# Patient Record
Sex: Female | Born: 1965 | Race: Black or African American | Hispanic: No | Marital: Married | Smoking: Former smoker
Health system: Southern US, Community
[De-identification: ages and names within clinical notes are randomized; demographics above are authoritative.]

## PROBLEM LIST (undated history)

## (undated) DIAGNOSIS — D649 Anemia, unspecified: Secondary | ICD-10-CM

## (undated) DIAGNOSIS — E559 Vitamin D deficiency, unspecified: Secondary | ICD-10-CM

## (undated) DIAGNOSIS — I10 Essential (primary) hypertension: Secondary | ICD-10-CM

## (undated) DIAGNOSIS — K219 Gastro-esophageal reflux disease without esophagitis: Secondary | ICD-10-CM

## (undated) DIAGNOSIS — D869 Sarcoidosis, unspecified: Secondary | ICD-10-CM

## (undated) HISTORY — DX: Anemia, unspecified: D64.9

## (undated) HISTORY — DX: Essential (primary) hypertension: I10

## (undated) HISTORY — DX: Sarcoidosis, unspecified: D86.9

## (undated) HISTORY — DX: Gastro-esophageal reflux disease without esophagitis: K21.9

## (undated) HISTORY — DX: Vitamin D deficiency, unspecified: E55.9

---

## 2009-01-19 HISTORY — PX: LAPAROSCOPIC GASTRIC BANDING WITH HIATAL HERNIA REPAIR: SHX6351

## 2013-02-05 ENCOUNTER — Encounter (INDEPENDENT_AMBULATORY_CARE_PROVIDER_SITE_OTHER): Payer: Self-pay | Admitting: General Surgery

## 2013-02-05 ENCOUNTER — Ambulatory Visit (INDEPENDENT_AMBULATORY_CARE_PROVIDER_SITE_OTHER): Payer: BC Managed Care – PPO | Admitting: General Surgery

## 2013-02-05 ENCOUNTER — Encounter (INDEPENDENT_AMBULATORY_CARE_PROVIDER_SITE_OTHER): Payer: Self-pay

## 2013-02-05 VITALS — BP 132/76 | HR 63 | Temp 98.0°F | Resp 18 | Ht 61.0 in | Wt 188.6 lb

## 2013-02-05 DIAGNOSIS — E669 Obesity, unspecified: Secondary | ICD-10-CM

## 2013-02-05 DIAGNOSIS — Z9884 Bariatric surgery status: Secondary | ICD-10-CM

## 2013-02-05 DIAGNOSIS — K219 Gastro-esophageal reflux disease without esophagitis: Secondary | ICD-10-CM

## 2013-02-05 NOTE — Patient Instructions (Addendum)
We are going to schedule a esophagram to evaluate your gastric band position and to make sure it is not too tight.  Eating techniques 20-20-20 (30-30-30) 20 chews, 20 seconds between bites of food, 20 minutes to eat; sometimes you may need 30 chews, 30 seconds etc Use your nondominant hand to eat with Use a child/infant size utensil Try not to eat while watching TV Put fork down between bites of food Focus on food while eating  Walk daily. Consider getting a pedometer and tracking your daily steps and making weekly goals to increase your daily step count  1. Stay on liquids for the next 2 days as you adapt to your new fill volume.  Then resume your previous diet. 2. Decreasing your carbohydrate intake will hasten you weight loss.  Rely more on proteins for your meals.  Avoid condiments that contain sweets such as Honey Mustard and sugary salad dressings.   3. Stay in the "green zone".  If you are regurgitating with meals, having night time reflux, and find yourself eating soft comfort foods (mashed potatoes, potato chips)...realize that you are developing "maladaptive eating".  You will not lose weight this way and may regain weight.  The GREEN ZONE is eating smaller portions and not regurgitating.  Hence we may need to withdraw fluid from your band. 4. Build exercise into your daily routine.  Walking is the best way to start but do something every day if you can.

## 2013-02-06 ENCOUNTER — Other Ambulatory Visit: Payer: Self-pay

## 2013-02-06 ENCOUNTER — Encounter (INDEPENDENT_AMBULATORY_CARE_PROVIDER_SITE_OTHER): Payer: Self-pay | Admitting: General Surgery

## 2013-02-06 DIAGNOSIS — Z9884 Bariatric surgery status: Secondary | ICD-10-CM | POA: Insufficient documentation

## 2013-02-06 NOTE — Progress Notes (Signed)
Patient ID: Becky Berger, female   DOB: 1966/01/07, 47 y.o.   MRN: [id number]  Chief Complaint  Patient presents with  . Bariatric Pre-op    lap band    HPI Becky Berger is a 47 y.o. female.   HPI 47 year old African American female status post laparoscopic adjustable gastric banding with hiatal hernia repair at [facility] on 01/19/2009 by Dr. Clent Ridges is here to transfer her bariatric care to our office. She states that her preoperative weight was 234 pounds. She was last seen in their office on 05/15/2011. Her weight at that time was 192.4 pounds. She states that she has been having issues for the past couple months. She states it is taking her about 45 minutes to eat a plate of food. Sometimes it takes her up to one hour. She states that she is taking small bites at a time. She states that she is not waiting a long period of time between bites of food. She reports a burning sensation in her upper chest primarily occurring at night. This has been going on for quite some time. She underwent an upper endoscopy in [place] by a local gastroenterologist which showed a recurrent hiatal hernia as well as distal gastritis. She was started on a PPI. She denies any nighttime cough. She reports a fair amount of burping. She is taking iron pills for anemia as well as a vitamin D supplement for vitamin D deficiency. She is not really getting much exercise right now. She states that she is taking lots of water. She works in [employer], site. She states that she now are correct having had a gastric band in place. She did not realize that it involved a long-term foreign body. She regrets not having the gastric bypass. Past Medical History  Diagnosis Date  . Sarcoidosis   . HTN (hypertension)   . Anemia   . Vitamin D deficiency disease   . GERD (gastroesophageal reflux disease)     Past Surgical History  Procedure Laterality Date  . Laparoscopic gastric banding with hiatal hernia  repair  01/19/2009    Dr Clent Ridges, [place]; Realize band    Family History  Problem Relation Age of Onset  . Diabetes Mother   . Hypertension Mother   . Cancer Father     prostate    Social History History  Substance Use Topics  . Smoking status: Never Smoker   . Smokeless tobacco: Not on file  . Alcohol Use: Not on file    Allergies  Allergen Reactions  . Sulfur Itching    Current Outpatient Prescriptions  Medication Sig Dispense Refill  . amphetamine-dextroamphetamine (ADDERALL) 20 MG tablet Take 20 mg by mouth daily.      Marland Kitchen atenolol-chlorthalidone (TENORETIC) 50-25 MG per tablet Take 1 tablet by mouth daily.      . Cholecalciferol (D3 SUPER STRENGTH) 2000 UNITS CAPS Take by mouth.      . folic acid (FOLVITE) 1 MG tablet Take 1 mg by mouth daily.      Marland Kitchen omeprazole (PRILOSEC) 20 MG capsule Take 20 mg by mouth daily.      . vitamin B-12 (CYANOCOBALAMIN) 500 MCG tablet Take 500 mcg by mouth daily.       No current facility-administered medications for this visit.    Review of Systems Review of Systems  Constitutional: Negative for fever, activity change, appetite change and unexpected weight change.  HENT: Negative for nosebleeds and trouble swallowing.   Eyes: Negative for  photophobia and visual disturbance.  Respiratory: Negative for chest tightness and shortness of breath.   Cardiovascular: Negative for chest pain and leg swelling.       Denies CP, SOB, orthopnea, PND, DOE  Genitourinary: Negative for dysuria and difficulty urinating.  Musculoskeletal: Negative for arthralgias.  Skin: Negative for pallor and rash.  Neurological: Negative for dizziness, seizures, facial asymmetry and numbness.       Denies TIA and amaurosis fugax   Hematological: Negative for adenopathy. Does not bruise/bleed easily.  Psychiatric/Behavioral: Negative for behavioral problems and agitation.    Blood pressure 132/76, pulse 63, temperature 98 F (36.7 C), resp. rate 18, height 5'  1" (1.549 m), weight 188 lb 9.6 oz (85.548 kg).  Physical Exam Physical Exam  Vitals reviewed. Constitutional: She is oriented to person, place, and time. She appears well-developed and well-nourished. No distress.  HENT:  Head: Normocephalic and atraumatic.  Right Ear: External ear normal.  Left Ear: External ear normal.  Eyes: Conjunctivae are normal. No scleral icterus.  Neck: Normal range of motion. Neck supple. No tracheal deviation present. No thyromegaly present.  Cardiovascular: Normal rate and normal heart sounds.   Pulmonary/Chest: Effort normal and breath sounds normal. No stridor. No respiratory distress. She has no wheezes.  Abdominal: Soft. She exhibits no distension. There is no tenderness. There is no rebound and no guarding.    Soft, nontender, nondistended. Well-healed trocar sites. Palpable port in the left mid abdomen  Musculoskeletal: She exhibits no edema and no tenderness.  Lymphadenopathy:    She has no cervical adenopathy.  Neurological: She is alert and oriented to person, place, and time. She exhibits normal muscle tone.  Skin: Skin is warm and dry. No rash noted. She is not diaphoretic. No erythema.  Psychiatric: She has a normal mood and affect. Her behavior is normal. Judgment and thought content normal.    Data Reviewed Dr. Clent Ridges as operative note from 01/19/2009 at [facility]-laparoscopic adjustable gastric band placement with hiatal hernia repair (realize band)  Office visit note from 05/15/2011-weight 192.4;Band assessed under fluoroscopy with one mL of saline added to give a total of 5 mL's of saline.   Labs from 12/12/2012-hemoglobin 12.9, hematocrit 38.1, platelet count 272, vitamin B 01/25/1991, folate greater than 19.9; hemoglobin A1c 5.9; vitamin D, 25-hydroxy 35.8; prior hemoglobin and hematocrit from 07/04/2012 was 8.4 and 28.2 respectively; Lipid panel from 07/03/2012-total cholesterol 151, triglycerides 35, HDL 46 LDL  98; TSH 0.914; vitamin D, 25-hydroxy-  13  EGD performed by Dr. Samuella Cota on September 3 Shows a small 1-2 cm hiatal hernia, mild pyloric gastritis  Assessment    Obesity, BMI 35.64 Anemia-well controlled Gastroesophageal reflux disease Recurrent hiatal hernia Vitamin D deficiency-stable Burping     Plan    We had a very long conversation. A large amount of time was spent counseling the patient. We discussed proper eating techniques and behaviors with a gastric band. We discussed the importance of taking one small bite, chewing 20-30 times, and waiting 20-30 seconds between bites of food.  With her having epigastric and upper chest burning at night as well as burping I do believe her band is too tight. I have recommended an adjustment. It is also possible that the "recurrent hiatal hernia" could represent a dilated pouch. Based on her upper endoscopy findings as well as her physical complaints I have recommended getting an upper GI to evaluate her anatomy. I do think she needs to have some fluid removed from her  band today.   After obtaining verbal consent, the abdominal wall was prepped with Chloraprep. The port was accessed with a Huber needle 5 cc was aspirated and 1.8 cc of saline was removed to give the patient an expected fill volume of 3.2 cc.  The patient was able to tolerate sips of water.  She was instructed still liquid for the next 24 hours and then advance to soft foods. She was given educational material regarding proper eating techniques and behaviors. We discussed importance of regular physical activity. She'll also be set up for an upper GI to evaluate her band anatomy. We will bring her back in 4 weeks to review the results. She was instructed to call the office and she had ongoing epigastric burning as well as burping  Mary Sella. Andrey Campanile, MD, FACS General, Bariatric, & Minimally Invasive Surgery [facility], [place]         [facility] M 02/06/2013, 11:08  AM

## 2013-02-07 ENCOUNTER — Encounter (INDEPENDENT_AMBULATORY_CARE_PROVIDER_SITE_OTHER): Payer: Self-pay

## 2013-02-10 ENCOUNTER — Ambulatory Visit
Admission: RE | Admit: 2013-02-10 | Discharge: 2013-02-10 | Disposition: A | Discharge status: Home / Self Care | Payer: BC Managed Care – PPO | Source: Ambulatory Visit | Attending: General Surgery | Admitting: General Surgery

## 2013-02-10 DIAGNOSIS — Z9884 Bariatric surgery status: Secondary | ICD-10-CM

## 2013-02-10 DIAGNOSIS — E669 Obesity, unspecified: Secondary | ICD-10-CM

## 2013-02-10 DIAGNOSIS — K219 Gastro-esophageal reflux disease without esophagitis: Secondary | ICD-10-CM

## 2013-02-12 ENCOUNTER — Telehealth (INDEPENDENT_AMBULATORY_CARE_PROVIDER_SITE_OTHER): Payer: Self-pay | Admitting: General Surgery

## 2013-02-12 NOTE — Telephone Encounter (Signed)
LMOM asking pt to return my call so that I can give her the DG esophagus results.

## 2013-02-12 NOTE — Telephone Encounter (Signed)
Message copied by Ignacia Marvel on Wed Feb 12, 2013 10:50 AM ------      Message from: Andrey Campanile, ERIC M      Created: Wed Feb 12, 2013  9:28 AM       pls call pt and let her know band appears to be in nml position. No evidence of hiatal hernia. No dilation of esophagus. Granted, i'm looking at images remotely and will review further when i return to office. She can followup with me the same day as her brother is scheduled. He drives her. Look back to my clinic schedule for the day i saw her and you can find his name and info. They can come on a Thursday. He can see andy and she can see me at the same time ------

## 2013-02-18 ENCOUNTER — Encounter (INDEPENDENT_AMBULATORY_CARE_PROVIDER_SITE_OTHER): Payer: Self-pay

## 2013-03-06 ENCOUNTER — Ambulatory Visit (INDEPENDENT_AMBULATORY_CARE_PROVIDER_SITE_OTHER): Payer: BC Managed Care – PPO | Admitting: General Surgery

## 2013-03-19 ENCOUNTER — Encounter (INDEPENDENT_AMBULATORY_CARE_PROVIDER_SITE_OTHER): Payer: Self-pay

## 2013-03-19 ENCOUNTER — Encounter (INDEPENDENT_AMBULATORY_CARE_PROVIDER_SITE_OTHER): Payer: Self-pay | Admitting: General Surgery

## 2013-03-19 ENCOUNTER — Ambulatory Visit (INDEPENDENT_AMBULATORY_CARE_PROVIDER_SITE_OTHER): Payer: BC Managed Care – PPO | Admitting: General Surgery

## 2013-03-19 VITALS — BP 116/80 | HR 60 | Temp 97.8°F | Resp 16 | Ht 61.0 in | Wt 190.4 lb

## 2013-03-19 DIAGNOSIS — Z9884 Bariatric surgery status: Secondary | ICD-10-CM

## 2013-03-19 DIAGNOSIS — E669 Obesity, unspecified: Secondary | ICD-10-CM

## 2013-03-19 DIAGNOSIS — Z4651 Encounter for fitting and adjustment of gastric lap band: Secondary | ICD-10-CM

## 2013-03-19 NOTE — Patient Instructions (Signed)
1. Stay on liquids for the next 2 days as you adapt to your new fill volume.  Then resume your previous diet. 2. Decreasing your carbohydrate intake will hasten you weight loss.  Rely more on proteins for your meals.  Avoid condiments that contain sweets such as Honey Mustard and sugary salad dressings.   3. Stay in the "green zone".  If you are regurgitating with meals, having night time reflux, and find yourself eating soft comfort foods (mashed potatoes, potato chips)...realize that you are developing "maladaptive eating".  You will not lose weight this way and may regain weight.  The GREEN ZONE is eating smaller portions and not regurgitating.  Hence we may need to withdraw fluid from your band. 4. Build exercise into your daily routine.  Walking is the best way to start but do something every day if you can.    Eating techniques 20-20-20 (30-30-30) 20 chews, 20 seconds between bites of food, 20 minutes to eat; sometimes you may need 30 chews, 30 seconds etc Use your nondominant hand to eat with Use a child/infant size utensil Try not to eat while watching TV

## 2013-03-19 NOTE — Progress Notes (Signed)
Subjective:     Patient ID: Becky Berger, female   DOB: Aug 29, 1965, 48 y.o.   MRN: [id number]  HPI  48 year old African female comes in for followup. I initially met her 02/05/2013. She transferred her care to her office. She had previously undergone laparoscopic adjustable gastric placement of a REALIZE band and hiatal hernia repair in November 2010 at [place]. At her initial visit last month she was having symptoms of her band being too tight. We removed 1.8 cc of fluid. We also got an upper GI which demonstrated normal positioning of the band without any signs of esophageal dilatation. She comes in today for followup. She states she is doing well. She is not having any reflux or regurgitation. It is no longer taking her an hour to the meal. However she is now able the large portions of food. She is hungry throughout the day. She denies any nighttime cough or reflux. She states that she has been practicing the eating behaviors that we talked about. She is now going to the gym about 3 times a week walking 2 miles at a time on a treadmill. She is also lifting weights.  PMHx, PSHx, SOCHx, FAMHx, ALL reviewed and unchanged  Review of Systems 10 point ROS negative except for HPI    Objective:   Physical Exam BP 116/80  Pulse 60  Temp(Src) 97.8 F (36.6 C) (Temporal)  Resp 16  Ht 5' 1"  (1.549 m)  Wt 190 lb 6.4 oz (86.365 kg)  BMI 35.99 kg/m2  See LapBand flowsheet  Gen: alert, NAD, non-toxic appearing HEENT: normocephalic, atraumatic; pupils equal, no scleral icterus, neck supple, no lymphadenopathy Pulm: Lungs clear to auscultation, symmetric chest rise CV: regular rate and rhythm Abd: soft, nontender, nondistended. Well-healed trocar sites. No incisional hernia. Port is in right mid-abdomen Ext: no edema, normal, symmetric strength Neuro: nonfocal, sensation grossly intact Psych: appropriate, judgment normal     Assessment:     Obesity BMI 35.98 Sarcoidosis Hypertension      Plan:     It appears her band is in good position and functioning properly. She has gained about 50 pounds since her initial visit. Her preoperative weight was 234 pounds. Because she is able to eat large portions of food and and is hungry between meals I recommended an adjustment.  After obtaining verbal consent, the abdominal wall was prepped with Chloraprep. The port was accessed with a Huber needle and 0.25 cc of saline was added to give the patient an expected fill volume of 3.45 cc.  The patient was able to tolerate sips of water.  She is instructed still liquids for the next 24 hours. We rediscussed proper eating techniques and behaviors.  We discussed the importance of changing the intensity while exercising in order to maximize caloric burn. Followup 4-6 weeks  Leighton Ruff. Redmond Pulling, MD, FACS General, Bariatric, & Minimally Invasive Surgery [facility], [place]

## 2013-05-01 ENCOUNTER — Encounter (INDEPENDENT_AMBULATORY_CARE_PROVIDER_SITE_OTHER): Payer: BC Managed Care – PPO

## 2013-10-16 ENCOUNTER — Ambulatory Visit (INDEPENDENT_AMBULATORY_CARE_PROVIDER_SITE_OTHER): Payer: BC Managed Care – PPO | Admitting: Physician Assistant

## 2013-10-16 ENCOUNTER — Encounter (INDEPENDENT_AMBULATORY_CARE_PROVIDER_SITE_OTHER): Payer: Self-pay

## 2013-10-16 VITALS — BP 122/84 | HR 64 | Temp 98.8°F | Resp 14 | Ht 61.0 in | Wt 191.4 lb

## 2013-10-16 DIAGNOSIS — Z4651 Encounter for fitting and adjustment of gastric lap band: Secondary | ICD-10-CM

## 2013-10-16 NOTE — Patient Instructions (Signed)

## 2013-10-16 NOTE — Progress Notes (Signed)
  HISTORY: Becky Berger is a 48 y.o.female who received an realize band in November 2010 in [place]. She is here as transfer of care. The patient has lost 1 lbs since their last visit in January, and has lost 43 lbs since surgery. She comes in with complaints of hunger and larger than desired portion sizes. She has no regurgitation or reflux symptoms. She had 1.8 mL fluid removed in December 2014 for over-restriction. She admits to not having a regular exercise program but she at least takes the stairs at work, avoiding the elevator.  VITAL SIGNS: Filed Vitals:   10/16/13 0920  BP: 122/84  Pulse: 64  Temp: 98.8 F (37.1 C)  Resp: 14    PHYSICAL EXAM: Physical exam reveals a very well-appearing 48 y.o.female in no apparent distress Neurologic: Awake, alert, oriented Psych: Bright affect, conversant Respiratory: Breathing even and unlabored. No stridor or wheezing Abdomen: Soft, nontender, nondistended to palpation. Incisions well-healed. No incisional hernias. Port easily palpated. Extremities: Atraumatic, good range of motion.  ASSESMENT: 48 y.o.  female  s/p Realize band.   PLAN: The patient's port was accessed with a 20G Huber needle without difficulty. Clear fluid was aspirated and 0.55 mL saline was added to the port to give a total predicted volume of 4 mL. The patient was able to swallow water without difficulty following the procedure and was instructed to take clear liquids for the next 24-48 hours and advance slowly as tolerated. I encouraged her to increase her physical activity. She describes having little energy at the end of the day so I suggested walking 15 minutes first thing in the morning and using this as a first step. We'll have her back in one month or sooner if needed.

## 2013-11-20 ENCOUNTER — Encounter (INDEPENDENT_AMBULATORY_CARE_PROVIDER_SITE_OTHER): Payer: BC Managed Care – PPO

## 2013-11-27 ENCOUNTER — Encounter (INDEPENDENT_AMBULATORY_CARE_PROVIDER_SITE_OTHER): Payer: BC Managed Care – PPO

## 2014-06-23 IMAGING — RF DG ESOPHAGUS
11 of 14 series · 19 of 24 positions shown · non-contrast
Comparison: None.

FLUOROSCOPY TIME:  1 min 30 seconds

CLINICAL DATA: Dysphagia.

EXAM:
ESOPHOGRAM / BARIUM SWALLOW / BARIUM TABLET STUDY
TECHNIQUE: Combined double contrast and single contrast examination performed
using effervescent crystals, thick barium liquid, and thin barium
liquid. The patient was observed with fluoroscopy swallowing a 13mm
barium sulphate tablet.

[Series 1: run · 4 of 12 slices shown (1 of 11)]
[im 1/12]
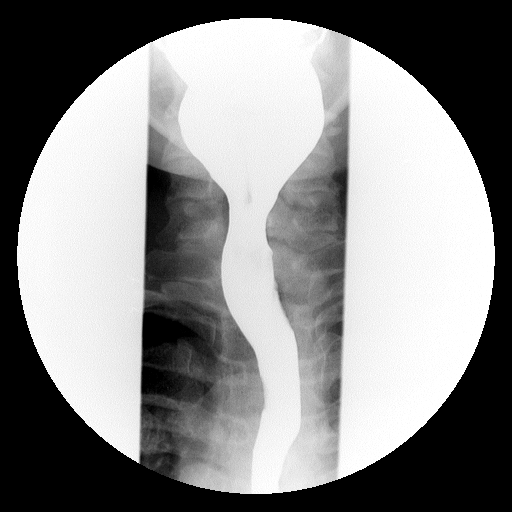
[im 3/12]
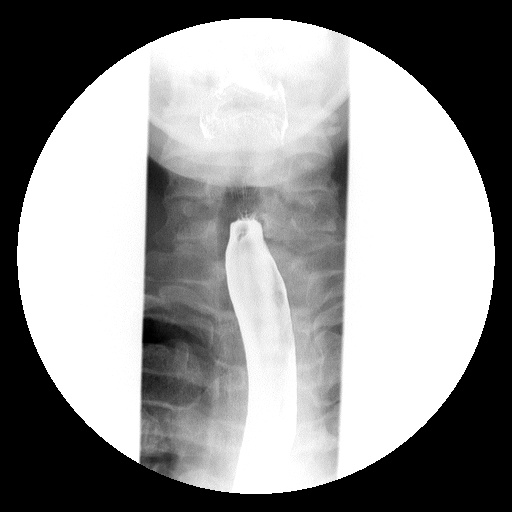
[im 9/12]
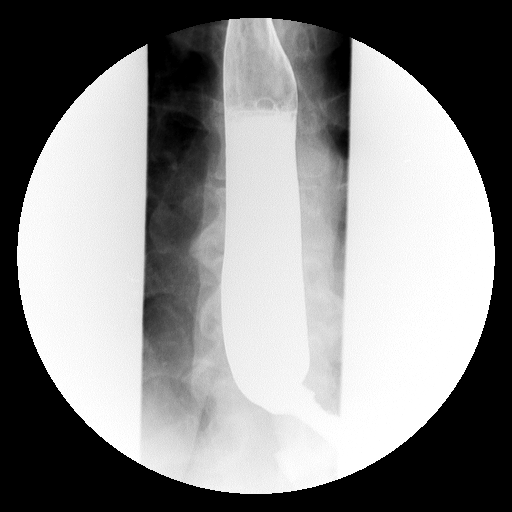
[im 12/12]
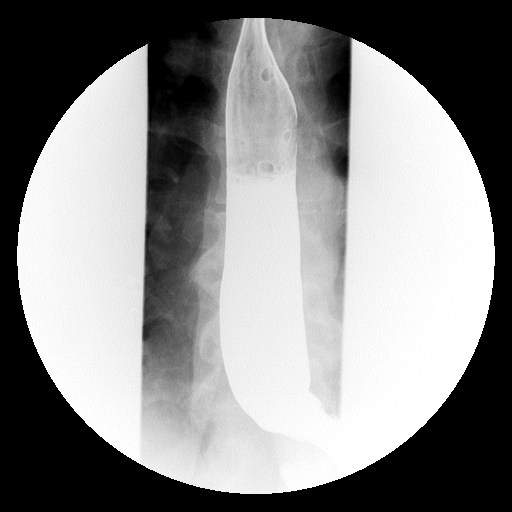

[Series 2: run · 1 of 2 slices shown (2 of 11)]
[im 1/2]
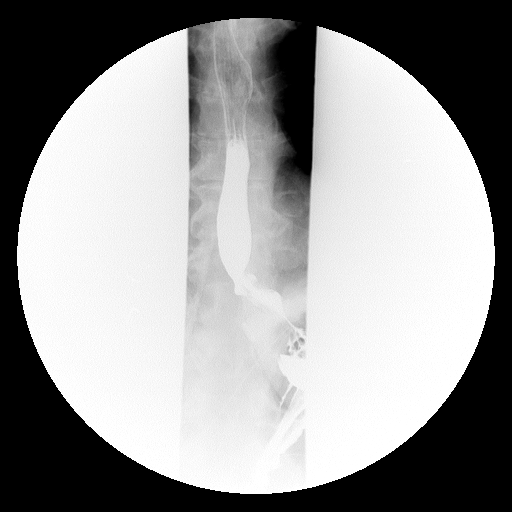

[Series 3: run · 1 of 1 slices shown (3 of 11)]
[im 1/1]
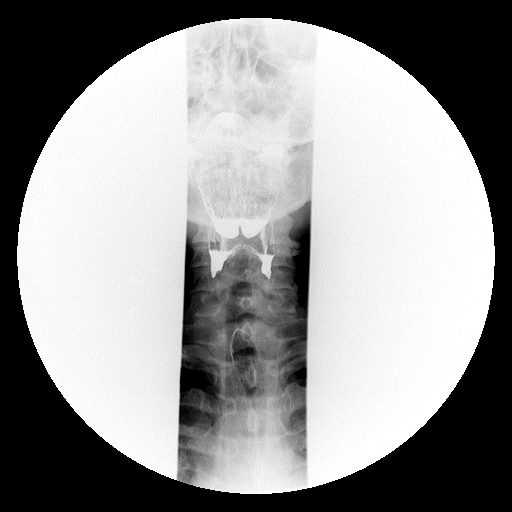

[Series 5: run · 6 of 14 slices shown (4 of 11)]
[im 1/14]
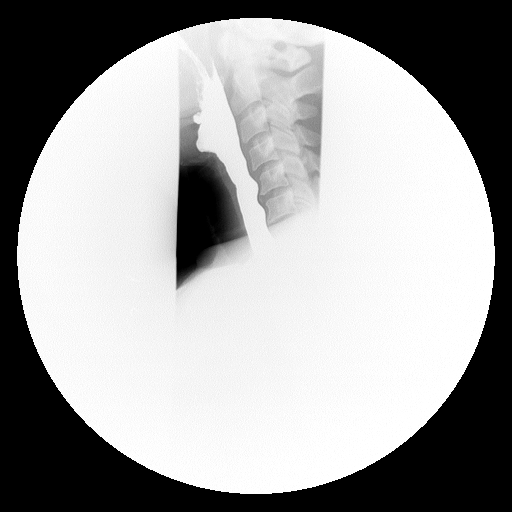
[im 3/14]
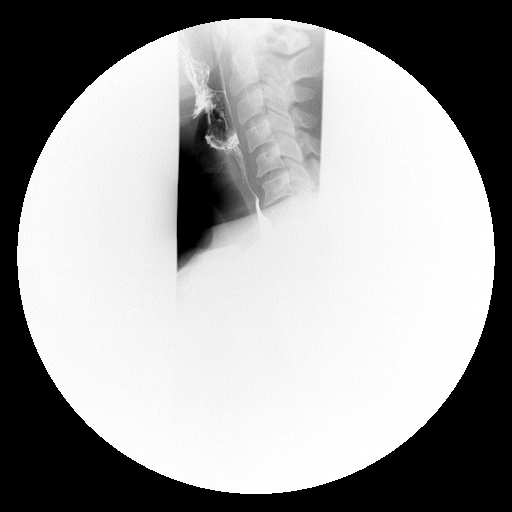
[im 5/14]
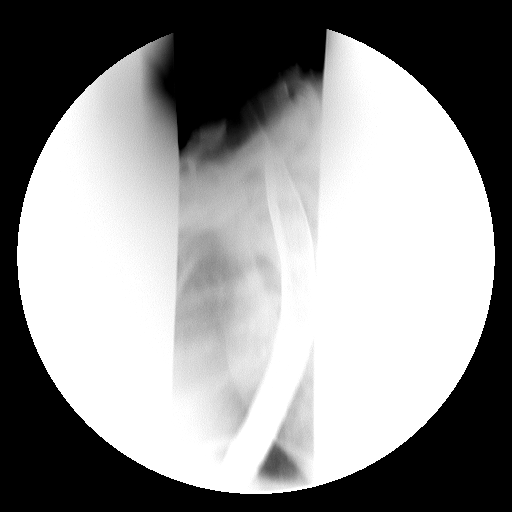
[im 9/14]
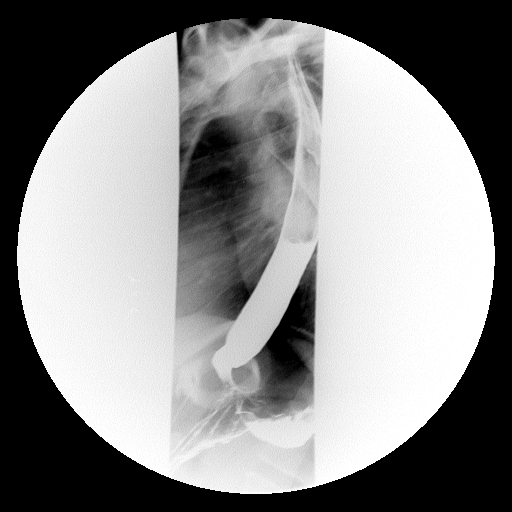
[im 11/14]
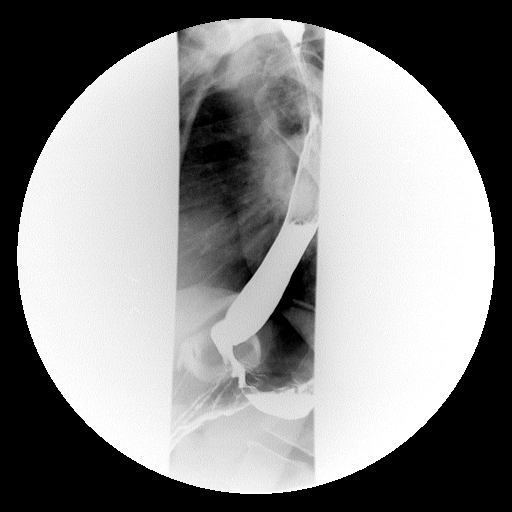
[im 14/14]
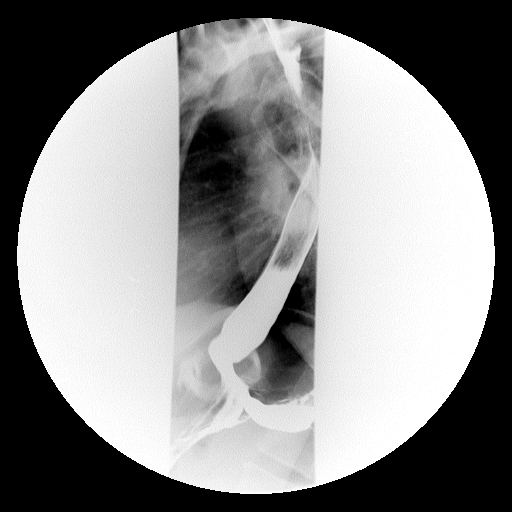

[Series 6: run · 1 of 1 slices shown (5 of 11)]
[im 1/1]
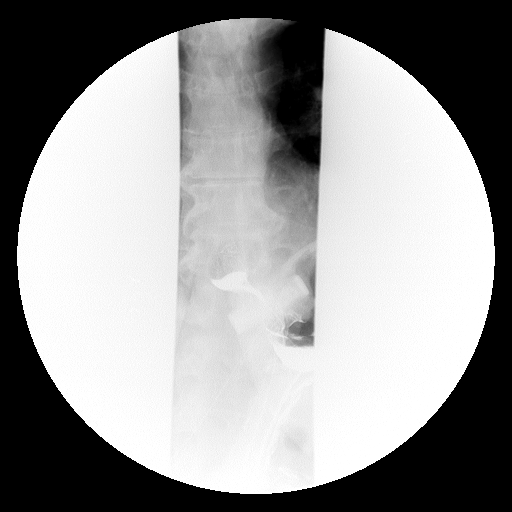

[Series 8: run · 1 of 1 slices shown (6 of 11)]
[im 1/1]
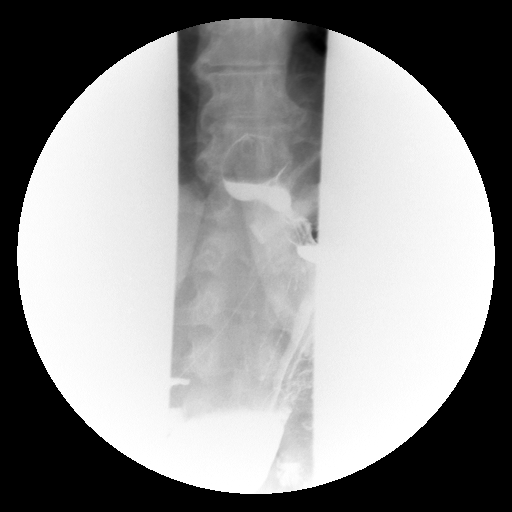

[Series 9: run · 1 of 1 slices shown (7 of 11)]
[im 1/1]
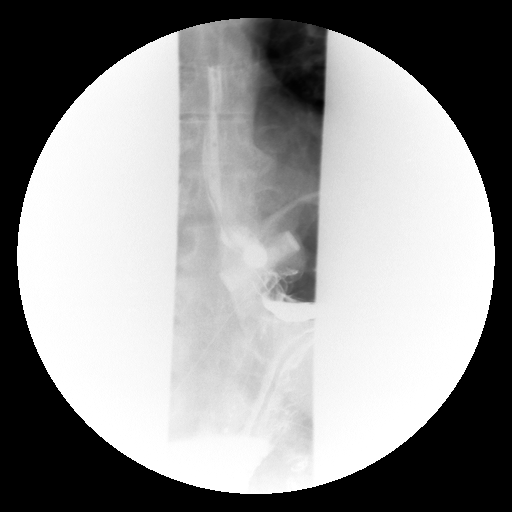

[Series 10: run · 1 of 2 slices shown (8 of 11)]
[im 1/2]
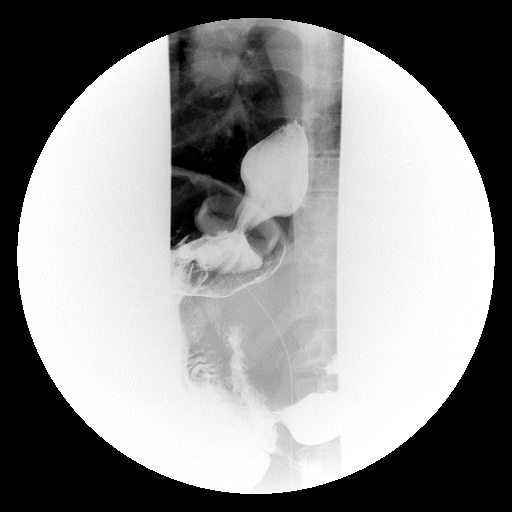

[Series 11: run · 1 of 2 slices shown (9 of 11)]
[im 1/2]
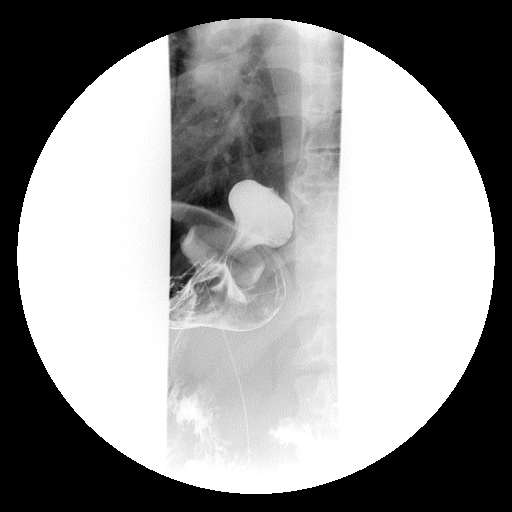

[Series 13: run · 1 of 1 slices shown (10 of 11)]
[im 1/1]
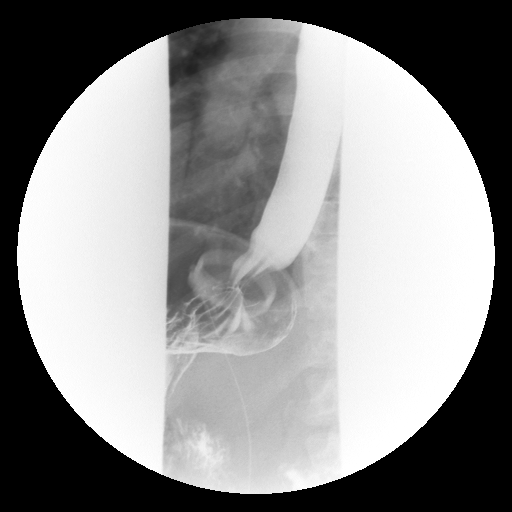

[Series 14: run · 1 of 1 slices shown (11 of 11)]
[im 1/1]
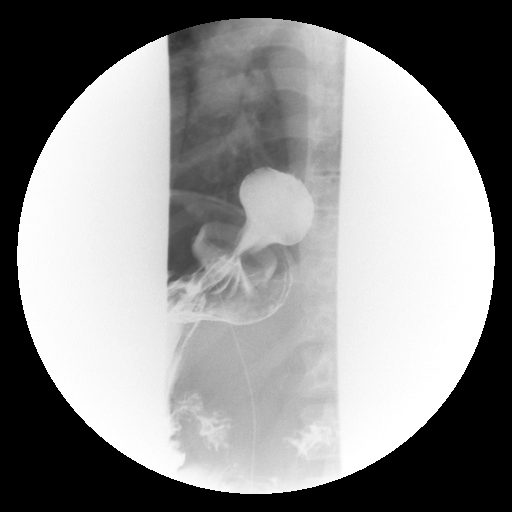

[19 of 24 positions shown; findings below may reference images not displayed]

FINDINGS: The mucosa and motility of the esophagus are normal with no evidence
of hiatal hernia or gastroesophageal reflux. The lap band appears in
good position. 13 mm barium tablet has delayed passage at the lap
band but did pass within 2-3 min without dissolution.
IMPRESSION: Essentially normal barium esophagram with lap band in place.
